# Patient Record
Sex: Male | Born: 1985 | Hispanic: No | Marital: Single | Smoking: Current every day smoker
Health system: Southern US, Community
[De-identification: ages and names within clinical notes are randomized; demographics above are authoritative.]

---

## 2016-09-21 ENCOUNTER — Emergency Department
Admission: EM | Admit: 2016-09-21 | Discharge: 2016-09-21 | Disposition: A | Discharge status: Home / Self Care | Payer: Self-pay | Attending: Emergency Medicine | Admitting: Emergency Medicine

## 2016-09-21 ENCOUNTER — Encounter: Payer: Self-pay | Admitting: Emergency Medicine

## 2016-09-21 ENCOUNTER — Emergency Department: Payer: Self-pay

## 2016-09-21 DIAGNOSIS — R079 Chest pain, unspecified: Secondary | ICD-10-CM

## 2016-09-21 DIAGNOSIS — R1013 Epigastric pain: Secondary | ICD-10-CM | POA: Insufficient documentation

## 2016-09-21 DIAGNOSIS — R0789 Other chest pain: Secondary | ICD-10-CM | POA: Insufficient documentation

## 2016-09-21 DIAGNOSIS — F17200 Nicotine dependence, unspecified, uncomplicated: Secondary | ICD-10-CM | POA: Insufficient documentation

## 2016-09-21 LAB — CBC WITH DIFFERENTIAL/PLATELET
BASOS PCT: 1 %
Basophils Absolute: 0.1 10*3/uL (ref 0–0.1)
EOS ABS: 0.1 10*3/uL (ref 0–0.7)
EOS PCT: 1 %
HCT: 41.6 % (ref 40.0–52.0)
HEMOGLOBIN: 14.7 g/dL (ref 13.0–18.0)
LYMPHS ABS: 2.5 10*3/uL (ref 1.0–3.6)
Lymphocytes Relative: 29 %
MCH: 29.9 pg (ref 26.0–34.0)
MCHC: 35.4 g/dL (ref 32.0–36.0)
MCV: 84.6 fL (ref 80.0–100.0)
Monocytes Absolute: 0.7 10*3/uL (ref 0.2–1.0)
Monocytes Relative: 9 %
NEUTROS PCT: 60 %
Neutro Abs: 5 10*3/uL (ref 1.4–6.5)
PLATELETS: 314 10*3/uL (ref 150–440)
RBC: 4.92 MIL/uL (ref 4.40–5.90)
RDW: 12.8 % (ref 11.5–14.5)
WBC: 8.4 10*3/uL (ref 3.8–10.6)

## 2016-09-21 LAB — COMPREHENSIVE METABOLIC PANEL
ALBUMIN: 4.5 g/dL (ref 3.5–5.0)
ALK PHOS: 57 U/L (ref 38–126)
ALT: 13 U/L — ABNORMAL LOW (ref 17–63)
AST: 20 U/L (ref 15–41)
Anion gap: 9 (ref 5–15)
BUN: 20 mg/dL (ref 6–20)
CHLORIDE: 108 mmol/L (ref 101–111)
CO2: 21 mmol/L — AB (ref 22–32)
Calcium: 9.6 mg/dL (ref 8.9–10.3)
Creatinine, Ser: 1.32 mg/dL — ABNORMAL HIGH (ref 0.61–1.24)
GFR calc non Af Amer: >60 mL/min (ref >60)
GLUCOSE: 110 mg/dL — AB (ref 65–99)
Potassium: 3 mmol/L — ABNORMAL LOW (ref 3.5–5.1)
SODIUM: 138 mmol/L (ref 135–145)
Total Bilirubin: 0.9 mg/dL (ref 0.3–1.2)
Total Protein: 7.4 g/dL (ref 6.5–8.1)

## 2016-09-21 LAB — LIPASE, BLOOD: Lipase: 24 U/L (ref 11–51)

## 2016-09-21 LAB — TROPONIN I

## 2016-09-21 MED ORDER — GI COCKTAIL ~~LOC~~
30.0000 mL | Freq: Once | ORAL | Status: AC
  Administered 2016-09-21: 30 mL via ORAL
  Filled 2016-09-21: qty 30

## 2016-09-21 MED ORDER — MORPHINE SULFATE (PF) 4 MG/ML IV SOLN
4.0000 mg | Freq: Once | INTRAVENOUS | Status: AC
  Administered 2016-09-21: 4 mg via INTRAVENOUS
  Filled 2016-09-21: qty 1

## 2016-09-21 MED ORDER — ONDANSETRON HCL 4 MG/2ML IJ SOLN
4.0000 mg | Freq: Once | INTRAMUSCULAR | Status: AC
  Administered 2016-09-21: 4 mg via INTRAVENOUS
  Filled 2016-09-21: qty 2

## 2016-09-21 MED ORDER — IOPAMIDOL (ISOVUE-370) INJECTION 76%
100.0000 mL | Freq: Once | INTRAVENOUS | Status: AC | PRN
  Administered 2016-09-21: 100 mL via INTRAVENOUS

## 2016-09-21 MED ORDER — SODIUM CHLORIDE 0.9 % IV BOLUS (SEPSIS)
1000.0000 mL | Freq: Once | INTRAVENOUS | Status: AC
  Administered 2016-09-21: 1000 mL via INTRAVENOUS

## 2016-09-21 NOTE — ED Notes (Signed)
Pt took asa unknown quantity at work prior to ems arrival.

## 2016-09-21 NOTE — ED Notes (Signed)
Pt and mother updated on treatment plan. Comfort measures provided to pt and mother.

## 2016-09-21 NOTE — ED Provider Notes (Signed)
[facility] Emergency Department Provider Note  ____________________________________________   First MD Initiated Contact with Patient 09/21/16 0114     (approximate)  I have reviewed the triage vital signs and the nursing notes.   HISTORY  Chief Complaint Chest Pain   HPI Jaime Irwin is a 31 y.o. male without any chronic medical conditions was presenting to the emergency department today with epigastric as well as abdominal and chest pain. He says it started suddenly at work and was a "25 out of 10." He took 3 aspirins at work and says the pain is reduced out of 10. He describes as a pressure that radiated through to his back. Denies any shortness of breath. However, does admit to mild nausea. Says that he has a history of reflux but this does not feel similar to his reflux. Says that he used cocaine by snorting several days ago. No history of sudden cardiac death at young ages in his family.   History reviewed. No pertinent past medical history.  There are no active problems to display for this patient.   History reviewed. No pertinent surgical history.  Prior to Admission medications   Not on File    Allergies Patient has no known allergies.  History reviewed. No pertinent family history.  Social History Social History  Substance Use Topics  . Smoking status: Current Every Day Smoker  . Smokeless tobacco: Never Used  . Alcohol use Yes    Review of Systems Constitutional: No fever/chills Eyes: No visual changes. ENT: No sore throat. Cardiovascular: as above Respiratory: Denies shortness of breath. Gastrointestinal: No abdominal pain. no vomiting.  No diarrhea.  No constipation. Genitourinary: Negative for dysuria. Musculoskeletal: Negative for back pain. Skin: Negative for rash. Neurological: Negative for headaches, focal weakness or numbness.  10-point ROS otherwise  negative.  ____________________________________________   PHYSICAL EXAM:  VITAL SIGNS: ED Triage Vitals  Enc Vitals Group     BP 09/21/16 0115 100/66     Pulse Rate 09/21/16 0113 86     Resp 09/21/16 0113 20     Temp 09/21/16 0115 98.1 F (36.7 C)     Temp Source 09/21/16 0113 Oral     SpO2 09/21/16 0113 98 %     Weight 09/21/16 0114 185 lb (83.9 kg)     Height 09/21/16 0114  (1.778 m)     Head Circumference --      Peak Flow --      Pain Score 09/21/16 0113 8     Pain Loc --      Pain Edu? --      Excl. in GC? --     Constitutional: Alert and oriented. Appears uncomfortable.  Eyes: Conjunctivae are normal. PERRL. EOMI. Head: Atraumatic. Nose: No congestion/rhinnorhea. Mouth/Throat: Mucous membranes are moist.   Neck: No stridor.   Cardiovascular: Normal rate, regular rhythm. Grossly normal heart sounds.  Good peripheral circulation with equal, intact bilateral radial as well as dorsalis pedis pulses. Respiratory: Normal respiratory effort.  No retractions. Lungs CTAB. Gastrointestinal: Soft with mild to moderate left lower quadrant tenderness to palpation. No abdominal bruits. No CVA tenderness. Musculoskeletal: No lower extremity tenderness nor edema.  No joint effusions. No tenderness to the thoracic back. No deformity or step-off. Neurologic:  Normal speech and language. No gross focal neurologic deficits are appreciated.  Skin:  Skin is warm, dry and intact. No rash noted. Psychiatric: Mood and affect are normal. Speech and behavior are normal.  ____________________________________________  LABS (all labs ordered are listed, but only abnormal results are displayed)  Labs Reviewed  COMPREHENSIVE METABOLIC PANEL - Abnormal; Notable for the following:       Result Value   Potassium 3.0 (*)    CO2 21 (*)    Glucose, Bld 110 (*)    Creatinine, Ser 1.32 (*)    ALT 13 (*)    All other components within normal limits  CBC WITH DIFFERENTIAL/PLATELET  LIPASE,  BLOOD  TROPONIN I  TROPONIN I   ____________________________________________  EKG  ED ECG REPORT I, Arelia Longest, the attending physician, personally viewed and interpreted this ECG.   Date: 09/21/2016  EKG Time: 0113  Rate: 79  Rhythm: normal sinus rhythm  Axis: Normal  Intervals:none  ST&T Change: Diffuse ST segment elevation and a concave morphology consistent with early repolarization. No ST depression or abnormal T-wave inversion.  ____________________________________________  RADIOLOGY    CT Angio Abd/Pel W and/or Wo Contrast (Final result)  Result time 09/21/16 02:36:30  Final result by Elgie Collard, MD (09/21/16 02:36:30)           Narrative:   CLINICAL DATA: 31 year old male with sudden onset central chest pain radiating to the back and nausea.  EXAM: CT ANGIOGRAPHY CHEST, ABDOMEN AND PELVIS  TECHNIQUE: Multidetector CT imaging through the chest, abdomen and pelvis was performed using the standard protocol during bolus administration of intravenous contrast. Multiplanar reconstructed images and MIPs were obtained and reviewed to evaluate the vascular anatomy.  CONTRAST: 100 cc Isovue 370  COMPARISON: None.  FINDINGS: CTA CHEST FINDINGS  Cardiovascular: There is no cardiomegaly or pericardial effusion. The thoracic aorta appears unremarkable. The origins of the great vessels of the aortic arch appear patent. The central pulmonary arteries are patent as well.  Mediastinum/Nodes: No enlarged mediastinal, hilar, or axillary lymph nodes. Thyroid gland, trachea, and esophagus demonstrate no significant findings.  Lungs/Pleura: Lungs are clear. No pleural effusion or pneumothorax.  Musculoskeletal: No chest wall abnormality. No acute or significant osseous findings.  Review of the MIP images confirms the above findings.  CTA ABDOMEN AND PELVIS FINDINGS  VASCULAR  Aorta: Normal caliber aorta without aneurysm, dissection,  vasculitis or significant stenosis.  Celiac: Patent without evidence of aneurysm, dissection, vasculitis or significant stenosis.  SMA: Patent without evidence of aneurysm, dissection, vasculitis or significant stenosis.  Renals: Both renal arteries are patent without evidence of aneurysm, dissection, vasculitis, fibromuscular dysplasia or significant stenosis.  IMA: Patent without evidence of aneurysm, dissection, vasculitis or significant stenosis.  Inflow: Patent without evidence of aneurysm, dissection, vasculitis or significant stenosis.  Veins: No obvious venous abnormality within the limitations of this arterial phase study.  Review of the MIP images confirms the above findings.  NON-VASCULAR  No intra-abdominal free air or free fluid.  Hepatobiliary: Probable mild fatty infiltration of the liver. No intrahepatic biliary ductal dilatation. The gallbladder is unremarkable.  Pancreas: Unremarkable. No pancreatic ductal dilatation or surrounding inflammatory changes.  Spleen: Normal in size without focal abnormality.  Adrenals/Urinary Tract: Adrenal glands are unremarkable. Kidneys are normal, without renal calculi, focal lesion, or hydronephrosis. Bladder is unremarkable.  Stomach/Bowel: Stomach is within normal limits. Appendix appears normal. No evidence of bowel wall thickening, distention, or inflammatory changes.  Lymphatic: No adenopathy.  Reproductive: The prostate and seminal vesicles are grossly unremarkable.  Other: Small fat containing umbilical hernia.  Musculoskeletal: No acute or significant osseous findings.  Review of the MIP images confirms the above findings.  IMPRESSION: No acute intrathoracic, abdominal, or  pelvic pathology. No evidence of aortic aneurysm or dissection.   Electronically Signed By: Elgie Collard M.D. On: 09/21/2016 02:36            CT Angio Chest Aorta W and/or Wo Contrast (Final result)  Result  time 09/21/16 02:36:30  Final result by Elgie Collard, MD (09/21/16 02:36:30)           Narrative:   CLINICAL DATA: 31 year old male with sudden onset central chest pain radiating to the back and nausea.  EXAM: CT ANGIOGRAPHY CHEST, ABDOMEN AND PELVIS  TECHNIQUE: Multidetector CT imaging through the chest, abdomen and pelvis was performed using the standard protocol during bolus administration of intravenous contrast. Multiplanar reconstructed images and MIPs were obtained and reviewed to evaluate the vascular anatomy.  CONTRAST: 100 cc Isovue 370  COMPARISON: None.  FINDINGS: CTA CHEST FINDINGS  Cardiovascular: There is no cardiomegaly or pericardial effusion. The thoracic aorta appears unremarkable. The origins of the great vessels of the aortic arch appear patent. The central pulmonary arteries are patent as well.  Mediastinum/Nodes: No enlarged mediastinal, hilar, or axillary lymph nodes. Thyroid gland, trachea, and esophagus demonstrate no significant findings.  Lungs/Pleura: Lungs are clear. No pleural effusion or pneumothorax.  Musculoskeletal: No chest wall abnormality. No acute or significant osseous findings.  Review of the MIP images confirms the above findings.  CTA ABDOMEN AND PELVIS FINDINGS  VASCULAR  Aorta: Normal caliber aorta without aneurysm, dissection, vasculitis or significant stenosis.  Celiac: Patent without evidence of aneurysm, dissection, vasculitis or significant stenosis.  SMA: Patent without evidence of aneurysm, dissection, vasculitis or significant stenosis.  Renals: Both renal arteries are patent without evidence of aneurysm, dissection, vasculitis, fibromuscular dysplasia or significant stenosis.  IMA: Patent without evidence of aneurysm, dissection, vasculitis or significant stenosis.  Inflow: Patent without evidence of aneurysm, dissection, vasculitis or significant stenosis.  Veins: No obvious venous  abnormality within the limitations of this arterial phase study.  Review of the MIP images confirms the above findings.  NON-VASCULAR  No intra-abdominal free air or free fluid.  Hepatobiliary: Probable mild fatty infiltration of the liver. No intrahepatic biliary ductal dilatation. The gallbladder is unremarkable.  Pancreas: Unremarkable. No pancreatic ductal dilatation or surrounding inflammatory changes.  Spleen: Normal in size without focal abnormality.  Adrenals/Urinary Tract: Adrenal glands are unremarkable. Kidneys are normal, without renal calculi, focal lesion, or hydronephrosis. Bladder is unremarkable.  Stomach/Bowel: Stomach is within normal limits. Appendix appears normal. No evidence of bowel wall thickening, distention, or inflammatory changes.  Lymphatic: No adenopathy.  Reproductive: The prostate and seminal vesicles are grossly unremarkable.  Other: Small fat containing umbilical hernia.  Musculoskeletal: No acute or significant osseous findings.  Review of the MIP images confirms the above findings.  IMPRESSION: No acute intrathoracic, abdominal, or pelvic pathology. No evidence of aortic aneurysm or dissection.   Electronically Signed By: Elgie Collard M.D. On: 09/21/2016 02:36            ____________________________________________   PROCEDURES  Procedure(s) performed:   Procedures  Critical Care performed:   ____________________________________________   INITIAL IMPRESSION / ASSESSMENT AND PLAN / ED COURSE  Pertinent labs & imaging results that were available during my care of the patient were reviewed by me and considered in my medical decision making (see chart for details).  ----------------------------------------- 3:25 AM on 09/21/2016 -----------------------------------------  Patient says that the pain as he is after morphine. Reassuring CT angiography without dissection or any other acute pathology.     ----------------------------------------- 5:23 AM on 09/21/2016 -----------------------------------------  Patient resting comfortably at this time. Mother is also at the bedside and says that she is a history of esophageal spasm. Patient also with a similar episode in the past but not as severe which was thought to be attributed to esophageal spasm. Patient to follow-up with GI. Will be discharged at this time. Very reassuring workup included 2 negative troponins as well as reassuring CTAs. He is aware of the results and the need for follow-up.  ____________________________________________   FINAL CLINICAL IMPRESSION(S) / ED DIAGNOSES  Chest pain.    NEW MEDICATIONS STARTED DURING THIS VISIT:  New Prescriptions   No medications on file     Note:  This document was prepared using Dragon voice recognition software and may include unintentional dictation errors.    Myrna Blazer, MD 09/21/16 [phone]

## 2016-09-21 NOTE — ED Notes (Signed)
Pt updated on plan of care. Pt verbalizes understanding.  

## 2016-09-21 NOTE — ED Triage Notes (Signed)
Pt states sudden onset of central chest pain with radiation to back and nausea at 2330. Pt states cocaine 5 days ago. Pt states did have shob.

## 2016-09-21 NOTE — ED Notes (Signed)
Patient transported to CT 

## 2016-09-21 NOTE — ED Notes (Signed)
Pt updated on results of lab work and progression of ct results. Pt verbalizes understanding.

## 2018-09-25 IMAGING — CT CT ANGIO CHEST
2 of 7 series · 15 of 46 positions shown · IV contrast (APPLIED)
Comparison: None.

CLINICAL DATA: 30-year-old male with sudden onset central chest
pain radiating to the back and nausea.

EXAM:
CT ANGIOGRAPHY CHEST, ABDOMEN AND PELVIS
TECHNIQUE: Multidetector CT imaging through the chest, abdomen and pelvis was
performed using the standard protocol during bolus administration of
intravenous contrast. Multiplanar reconstructed images and MIPs were
obtained and reviewed to evaluate the vascular anatomy.
CONTRAST:  100 cc Isovue 370

[Series 6: axial arterial · axial · arterial · 0.69mm/px · z∈[-1029,-447]mm · 12 of 218 slices shown]
[im 12/218  lung]
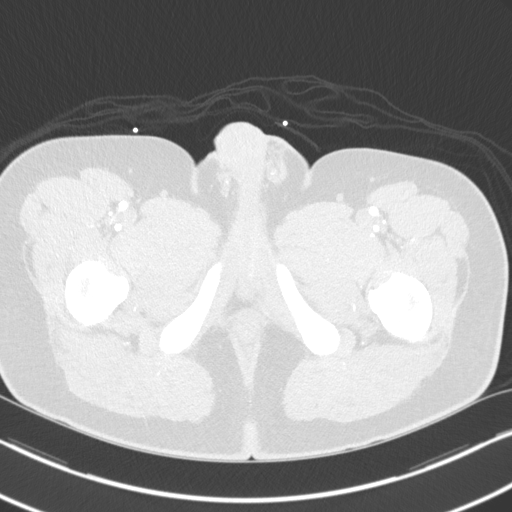
[im 35/218  soft-tissue]
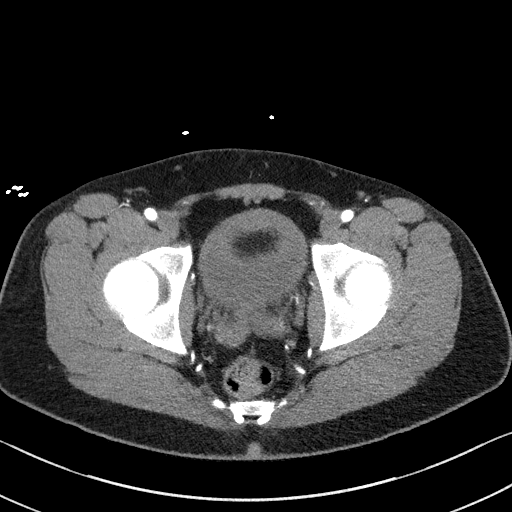
[im 46/218  lung]
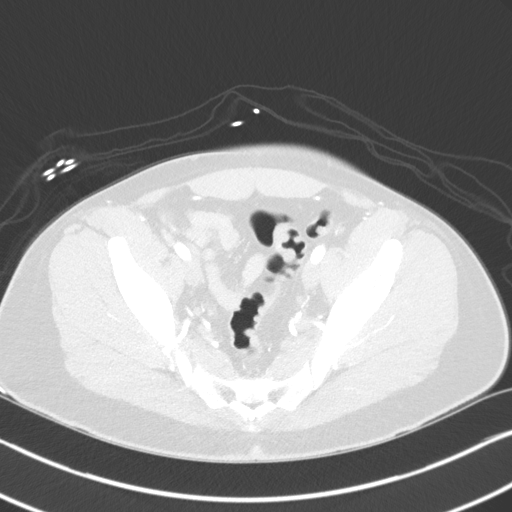
[im 69/218  soft-tissue]
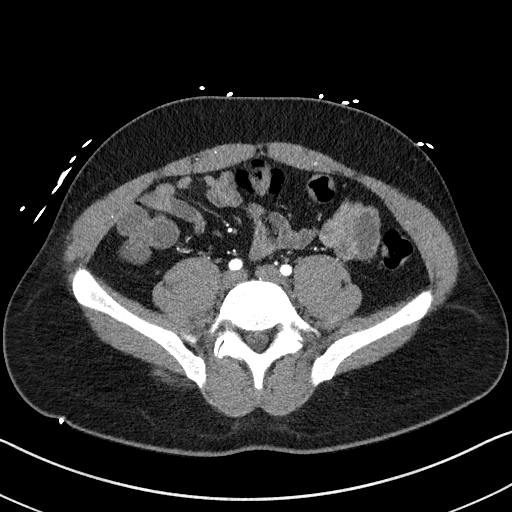
[im 80/218  lung]
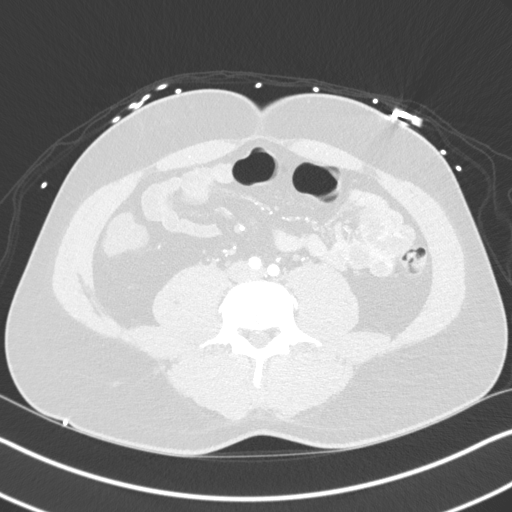
[im 103/218  soft-tissue]
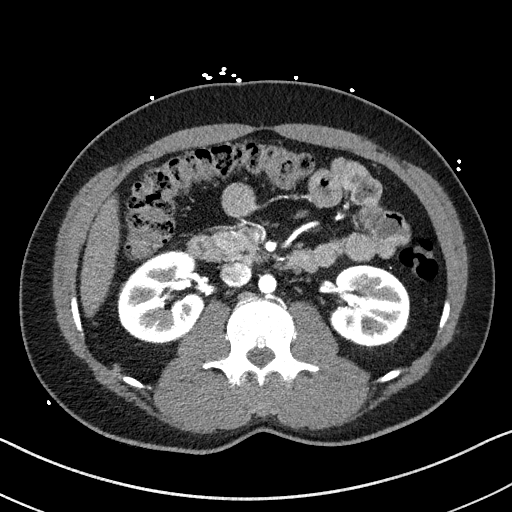
[im 115/218  lung]
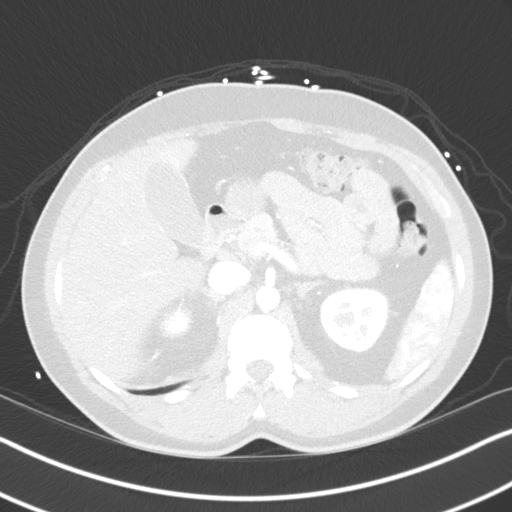
[im 138/218  soft-tissue]
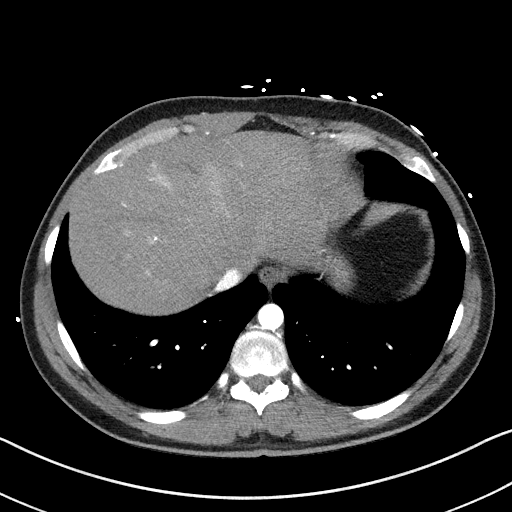
[im 149/218  lung]
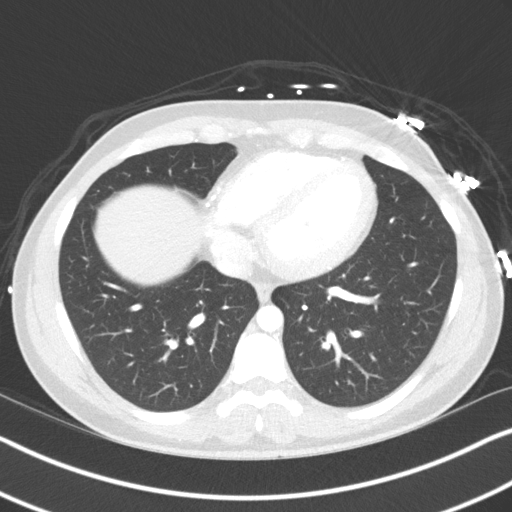
[im 172/218  soft-tissue]
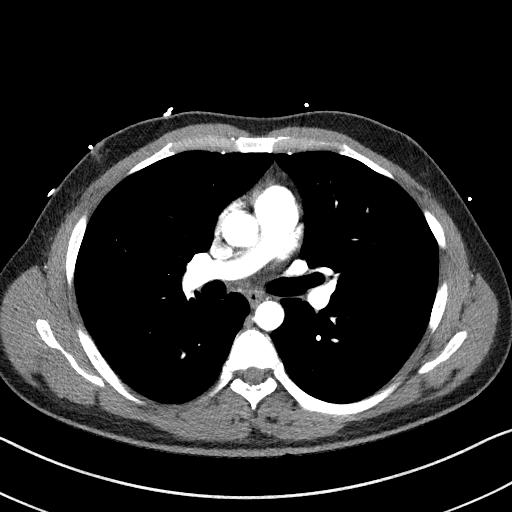
[im 183/218  lung]
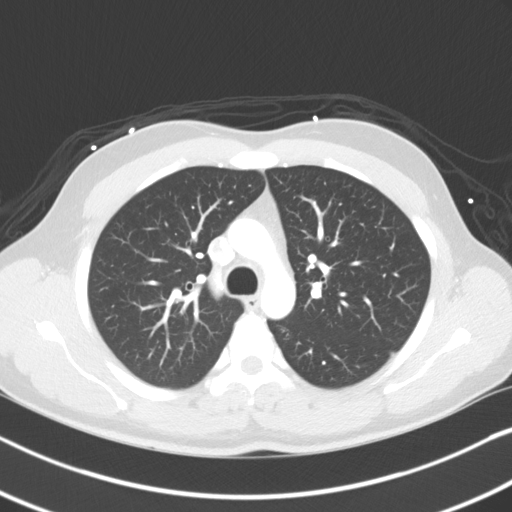
[im 206/218  soft-tissue]
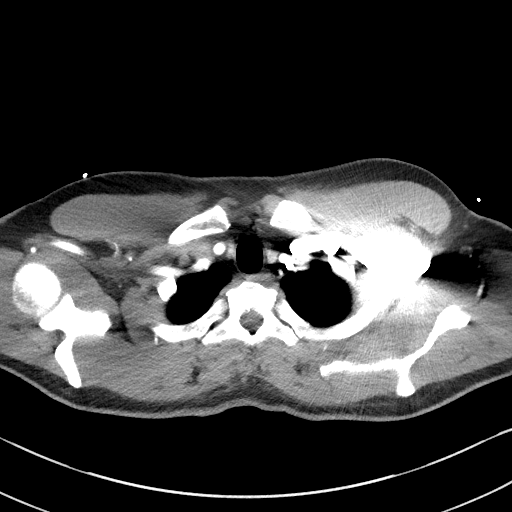

[Series 8: coronals · coronal · 0.67mm/px · 3 of 129 slices shown]
[im 33/129  soft-tissue]
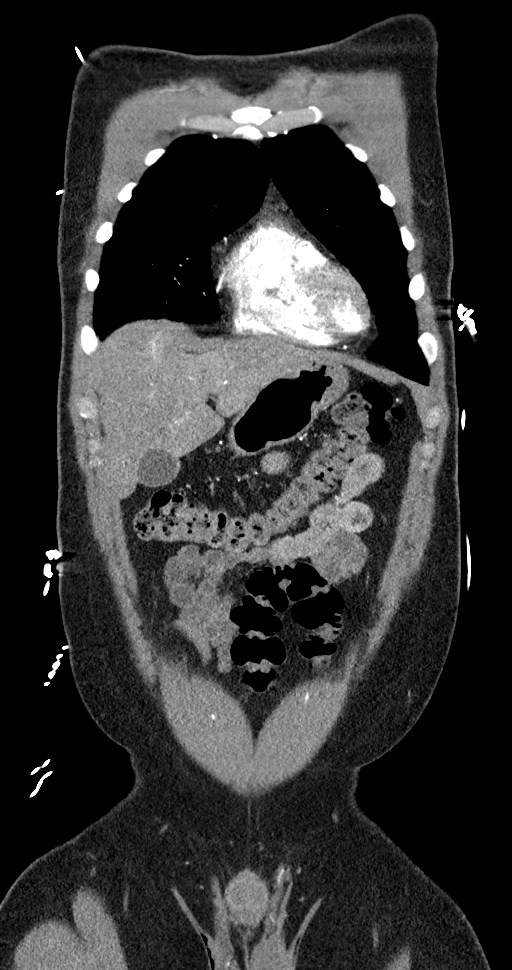
[im 65/129  soft-tissue]
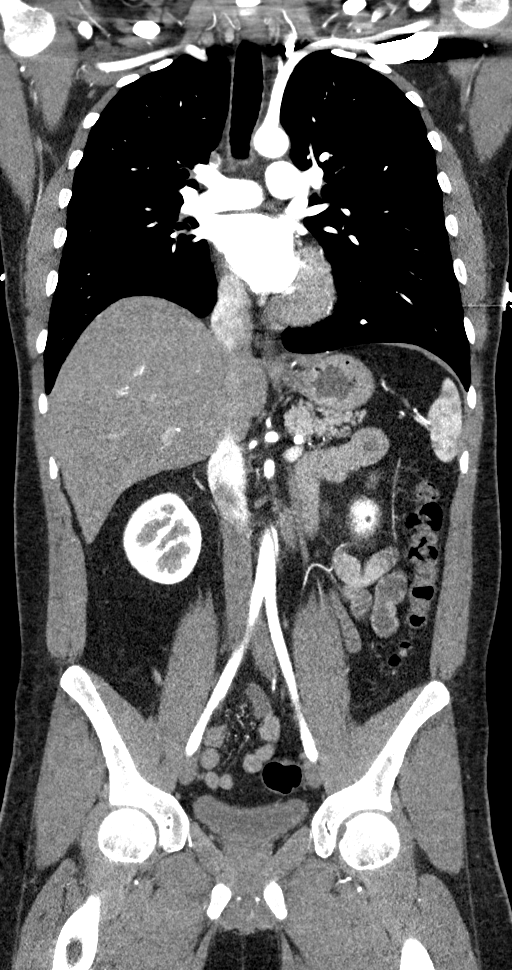
[im 97/129  soft-tissue]
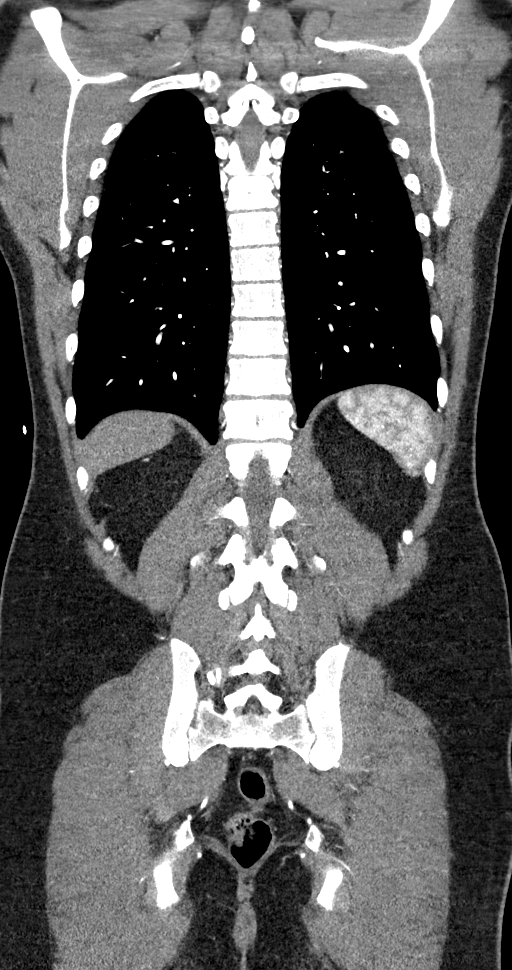

[15 of 46 positions shown; findings below may reference images not displayed]

FINDINGS: CTA CHEST FINDINGS

Cardiovascular: There is no cardiomegaly or pericardial effusion.
The thoracic aorta appears unremarkable. The origins of the great
vessels of the aortic arch appear patent. The central pulmonary
arteries are patent as well.

Mediastinum/Nodes: No enlarged mediastinal, hilar, or axillary lymph
nodes. Thyroid gland, trachea, and esophagus demonstrate no
significant findings.

Lungs/Pleura: Lungs are clear. No pleural effusion or pneumothorax.

Musculoskeletal: No chest wall abnormality. No acute or significant
osseous findings.

Review of the MIP images confirms the above findings.

CTA ABDOMEN AND PELVIS FINDINGS

VASCULAR

Aorta: Normal caliber aorta without aneurysm, dissection, vasculitis
or significant stenosis.

Celiac: Patent without evidence of aneurysm, dissection, vasculitis
or significant stenosis.

SMA: Patent without evidence of aneurysm, dissection, vasculitis or
significant stenosis.

Renals: Both renal arteries are patent without evidence of aneurysm,
dissection, vasculitis, fibromuscular dysplasia or significant
stenosis.

IMA: Patent without evidence of aneurysm, dissection, vasculitis or
significant stenosis.

Inflow: Patent without evidence of aneurysm, dissection, vasculitis
or significant stenosis.

Veins: No obvious venous abnormality within the limitations of this
arterial phase study.

Review of the MIP images confirms the above findings.

NON-VASCULAR

No intra-abdominal free air or free fluid.

Hepatobiliary: Probable mild fatty infiltration of the liver. No
intrahepatic biliary ductal dilatation. The gallbladder is
unremarkable.

Pancreas: Unremarkable. No pancreatic ductal dilatation or
surrounding inflammatory changes.

Spleen: Normal in size without focal abnormality.

Adrenals/Urinary Tract: Adrenal glands are unremarkable. Kidneys are
normal, without renal calculi, focal lesion, or hydronephrosis.
Bladder is unremarkable.

Stomach/Bowel: Stomach is within normal limits. Appendix appears
normal. No evidence of bowel wall thickening, distention, or
inflammatory changes.

Lymphatic: No adenopathy.

Reproductive: The prostate and seminal vesicles are grossly
unremarkable.

Other: Small fat containing umbilical hernia.

Musculoskeletal: No acute or significant osseous findings.

Review of the MIP images confirms the above findings.
IMPRESSION: No acute intrathoracic, abdominal, or pelvic pathology. No evidence
of aortic aneurysm or dissection.

## 2018-09-30 DEATH — deceased
